# Patient Record
Sex: Male | Born: 1970 | Race: White | Hispanic: No | Marital: Married | Smoking: Never smoker
Health system: Southern US, Community
[De-identification: ages and names within clinical notes are randomized; demographics above are authoritative.]

---

## 2005-09-03 ENCOUNTER — Encounter: Admission: RE | Admit: 2005-09-03 | Discharge: 2005-09-03 | Payer: Self-pay | Admitting: Sports Medicine

## 2005-10-01 ENCOUNTER — Ambulatory Visit (HOSPITAL_COMMUNITY): Admission: RE | Admit: 2005-10-01 | Discharge: 2005-10-01 | Payer: Self-pay | Admitting: Orthopedic Surgery

## 2007-02-21 IMAGING — US US ASPIRATION
1 series · 1 of 1 positions shown · non-contrast
Comparison: none

CLINICAL DATA: Posterotibial tendinopathy.  
 ULTRASOUND GUIDED INJECTION OF THE RIGHT POSTERIOR TIBIAL TENDON SHEATH:
 The skin overlying the distal posterior tibial tendon was cleansed and draped in a sterile fashion.  Skin anesthesia was carried [DATE]% Lidocaine.  A 19 gauge needle was taken down adjacent to the tendon.  I injected 100 mg Depo-Medrol mixed with 1 cc of 1% Lidocaine, with sonographic demonstration of the liquid flowing within the tendon sheath.  The procedure was well-tolerated. The patient was discharged in good condition.

[Series 1: unknown · 0.06mm/px · 1 of 1 slices shown]
[im 1/1]
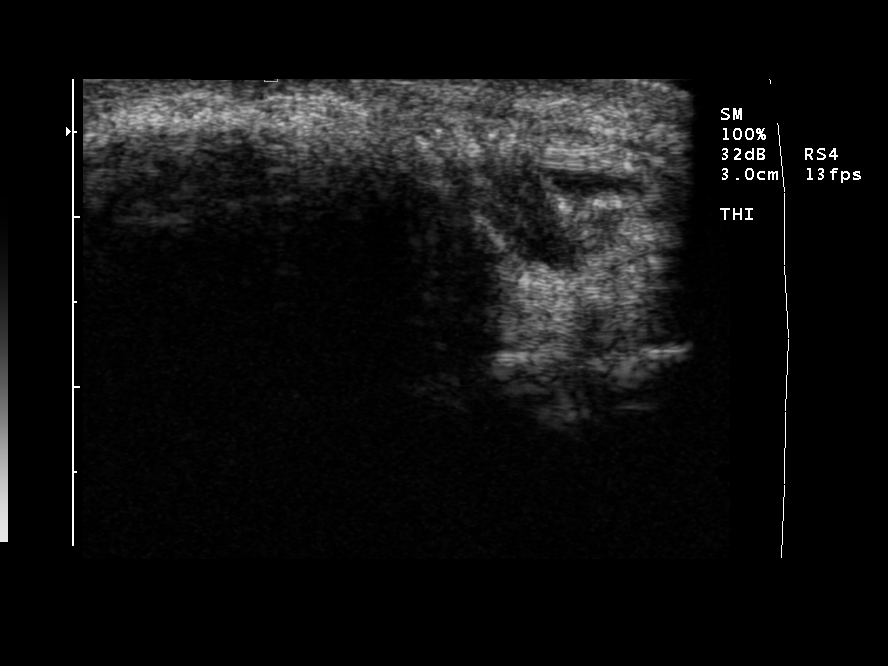

[1 of 1 positions shown; findings below may reference images not displayed]

IMPRESSION: Technically successful ultrasound guided injection of the tendon sheath of the posterior tibial tendon on the right.

## 2019-07-25 ENCOUNTER — Encounter: Payer: Self-pay | Admitting: Orthopaedic Surgery

## 2019-07-25 ENCOUNTER — Ambulatory Visit (INDEPENDENT_AMBULATORY_CARE_PROVIDER_SITE_OTHER): Payer: Managed Care, Other (non HMO) | Admitting: Orthopaedic Surgery

## 2019-07-25 ENCOUNTER — Other Ambulatory Visit: Payer: Self-pay

## 2019-07-25 DIAGNOSIS — G5601 Carpal tunnel syndrome, right upper limb: Secondary | ICD-10-CM

## 2019-07-25 NOTE — Progress Notes (Signed)
   Office Visit Note   Patient: Douglas Benson           Date of Birth: 06/04/1971           MRN: [id number] Visit Date: 07/25/2019              Requested by: No referring provider defined for this encounter. PCP: Julian Hy, MD   Assessment & Plan: Visit Diagnoses:  1. Right carpal tunnel syndrome     Plan: Impression is severe right carpal tunnel syndrome found on NCS/EMG.  Based on discussion patient has elected to proceed with surgical intervention.  Risk, benefits and poss complications reviewed.  Rehab recovery time discussed.  All questions were answered.  Follow-Up Instructions: Return for one week post-op.   Orders:  No orders of the defined types were placed in this encounter.  No orders of the defined types were placed in this encounter.     Procedures: No procedures performed   Clinical Data: No additional findings.   Subjective: Chief Complaint  Patient presents with  . 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  He has been dealing with this for about a year.  He was seen by his primary care provider in December 2020 where nerve conduction study was ordered.  This showed severe median nerve compression on the right.  He was sent to [place] for further evaluation treatment recommendation.  He does note pain to the wrist and arm as well as some numbness and tingling to the median nerve distribution.  He has trouble making a fist due to this.  He does note his symptoms have improved over the past 2 months since starting gabapentin.  Review of Systems as detailed in HPI.  All others reviewed and are negative.   Objective: Vital Signs: There were no vitals taken for this visit.  Physical Exam well-developed well-nourished gentleman in no acute distress.  Alert and oriented x3.  Ortho Exam  Specialty Comments:  No specialty comments available.  Imaging: No  new imaging   PMFS History: There are no problems to display for this patient.  History reviewed. No pertinent past medical history.  History reviewed. No pertinent family history.  History reviewed. No pertinent surgical history. Social History   Occupational History  . Not on file  Tobacco Use  . Smoking status: Not on file  Substance and Sexual Activity  . Alcohol use: Not on file  . Drug use: Not on file  . Sexual activity: Not on file

## 2019-08-03 ENCOUNTER — Other Ambulatory Visit: Payer: Self-pay | Admitting: Orthopaedic Surgery

## 2019-08-03 DIAGNOSIS — G5601 Carpal tunnel syndrome, right upper limb: Secondary | ICD-10-CM | POA: Diagnosis not present

## 2019-08-10 ENCOUNTER — Ambulatory Visit (INDEPENDENT_AMBULATORY_CARE_PROVIDER_SITE_OTHER): Payer: Managed Care, Other (non HMO) | Admitting: Physician Assistant

## 2019-08-10 ENCOUNTER — Other Ambulatory Visit: Payer: Self-pay

## 2019-08-10 DIAGNOSIS — Z9889 Other specified postprocedural states: Secondary | ICD-10-CM

## 2019-08-10 NOTE — Progress Notes (Signed)
   Post-Op Visit Note   Patient: Douglas Benson           Date of Birth: [birth year]           MRN: [id number] Visit Date: 08/10/2019 PCP: Julian Hy, MD   Assessment & Plan:  Chief Complaint:  Chief Complaint  Patient presents with  . Right Hand - Routine Post Op   Visit Diagnoses:  1. S/P carpal tunnel release     Plan: Patient is a pleasant 49 year old gentleman who comes in today 1 week out right carpal tunnel release, date of surgery 08/03/2019.  He has been doing well.  He did remove the splint 3 days postop.  No fevers or chills.  No pain.  Examination of the right hand reveals a well-healing surgical incision without complication.  Nylon sutures in place.  Today, we will reapply Band-Aid and removable splint.  There will be no submerging his hand in water or heavy lifting for another 3 weeks.  He will follow-up with [place] in 1 week's time for repeat evaluation and suture removal.  Call with concerns or questions anytime.  Follow-Up Instructions: Return in about 1 week (around 08/17/2019).   Orders:  No orders of the defined types were placed in this encounter.  No orders of the defined types were placed in this encounter.   Imaging: No new imaging  PMFS History: There are no problems to display for this patient.  No past medical history on file.  No family history on file.  No past surgical history on file. Social History   Occupational History  . Not on file  Tobacco Use  . Smoking status: Not on file  Substance and Sexual Activity  . Alcohol use: Not on file  . Drug use: Not on file  . Sexual activity: Not on file

## 2019-08-17 ENCOUNTER — Other Ambulatory Visit: Payer: Self-pay

## 2019-08-17 ENCOUNTER — Encounter: Payer: Self-pay | Admitting: Physician Assistant

## 2019-08-17 ENCOUNTER — Ambulatory Visit (INDEPENDENT_AMBULATORY_CARE_PROVIDER_SITE_OTHER): Payer: Managed Care, Other (non HMO) | Admitting: Physician Assistant

## 2019-08-17 DIAGNOSIS — Z9889 Other specified postprocedural states: Secondary | ICD-10-CM

## 2019-08-17 MED ORDER — MUPIROCIN 2 % EX OINT: TOPICAL_OINTMENT | CUTANEOUS | 0 refills | Status: AC

## 2019-08-17 NOTE — Progress Notes (Signed)
   Post-Op Visit Note   Patient: Douglas Benson           Date of Birth: 1971/01/24           MRN: [id number] Visit Date: 08/17/2019 PCP: Julian Hy, MD   Assessment & Plan:  Chief Complaint:  Chief Complaint  Patient presents with  . Post-op Follow-up   Visit Diagnoses:  1. S/P carpal tunnel release     Plan: Patient is a pleasant 49 year old gentleman who comes in today 2 weeks out right carpal tunnel release.  He has been doing well.  No fevers or chills.  No pain.  Examination of his right wrist reveals a well-healed surgical incision with nylon sutures in place.  No complication.  Today, nylon sutures were removed.  Mupirocin and a Band-Aid were applied.  We will have him do this twice a day for the next few weeks.  No heavy lifting or submerging his hand in water for another 2 weeks.  Follow-up with [place] in 4 weeks time for recheck in anticipation of return to work full duty.  Call with concerns or questions in the meantime.  Follow-Up Instructions: Return in about 4 weeks (around 09/14/2019).   Orders:  No orders of the defined types were placed in this encounter.  Meds ordered this encounter  Medications  . mupirocin ointment (BACTROBAN) 2 %    Sig: Apply to affected area 2 times daily    Dispense:  22 g    Refill:  0    Imaging: No new imaging  PMFS History: There are no problems to display for this patient.  History reviewed. No pertinent past medical history.  History reviewed. No pertinent family history.  History reviewed. No pertinent surgical history. Social History   Occupational History  . Not on file  Tobacco Use  . Smoking status: Not on file  Substance and Sexual Activity  . Alcohol use: Not on file  . Drug use: Not on file  . Sexual activity: Not on file

## 2019-08-18 ENCOUNTER — Telehealth: Payer: Self-pay

## 2019-08-18 NOTE — Telephone Encounter (Signed)
Patient called concerning incision opening after having his stitches were removed on Thursday, 08/17/2019 for RT carpal tunnel release.  Stated that he was changing his bandage and noticed that incision was open a little.   Talked with Dwana Melena and advised her of message above.  Per Stanton Kidney, patient is to continue using Mupirocin ointment and a band aid over incision twice a day and to check incision to make sure it is not bleeding. Advised patient of Mary's message and he voiced that he understands.

## 2019-09-13 ENCOUNTER — Encounter: Payer: Self-pay | Admitting: Physician Assistant

## 2019-09-13 ENCOUNTER — Ambulatory Visit (INDEPENDENT_AMBULATORY_CARE_PROVIDER_SITE_OTHER): Payer: Managed Care, Other (non HMO) | Admitting: Physician Assistant

## 2019-09-13 ENCOUNTER — Other Ambulatory Visit: Payer: Self-pay

## 2019-09-13 DIAGNOSIS — Z9889 Other specified postprocedural states: Secondary | ICD-10-CM

## 2019-09-13 NOTE — Progress Notes (Signed)
   Post-Op Visit Note   Patient: Douglas Benson           Date of Birth: [birth year]           MRN: [id number] Visit Date: 09/13/2019 PCP: Julian Hy, MD   Assessment & Plan:  Chief Complaint:  Chief Complaint  Patient presents with  . Right Hand - Follow-up   Visit Diagnoses:  1. S/P carpal tunnel release     Plan: Patient is a pleasant 49 year old gentleman comes in today 6 weeks status post right carpal tunnel release.  He has been doing well.  No complaints of pain.  No numbness, tingling or burning.  He is ready to return to work.  Examination of his right hand reveals a fully healed surgical scar without complication. He is neurovascular intact distally.  At this point, we will write him to return to work full duty without restrictions this Friday, 09/15/2019.  He will follow-up with [place] as needed.  Follow-Up Instructions: Return if symptoms worsen or fail to improve.   Orders:  No orders of the defined types were placed in this encounter.  No orders of the defined types were placed in this encounter.   Imaging: No new imaging  PMFS History: There are no problems to display for this patient.  History reviewed. No pertinent past medical history.  History reviewed. No pertinent family history.  History reviewed. No pertinent surgical history. Social History   Occupational History  . Not on file  Tobacco Use  . Smoking status: Not on file  Substance and Sexual Activity  . Alcohol use: Not on file  . Drug use: Not on file  . Sexual activity: Not on file

## 2021-08-05 ENCOUNTER — Ambulatory Visit: Payer: Managed Care, Other (non HMO) | Admitting: Dermatology

## 2021-12-10 ENCOUNTER — Encounter: Payer: Self-pay | Admitting: Dermatology

## 2021-12-10 ENCOUNTER — Ambulatory Visit (INDEPENDENT_AMBULATORY_CARE_PROVIDER_SITE_OTHER): Payer: Managed Care, Other (non HMO) | Admitting: Dermatology

## 2021-12-10 DIAGNOSIS — L570 Actinic keratosis: Secondary | ICD-10-CM | POA: Diagnosis not present

## 2021-12-10 DIAGNOSIS — W57XXXA Bitten or stung by nonvenomous insect and other nonvenomous arthropods, initial encounter: Secondary | ICD-10-CM

## 2021-12-10 DIAGNOSIS — Z1283 Encounter for screening for malignant neoplasm of skin: Secondary | ICD-10-CM

## 2021-12-10 DIAGNOSIS — D1801 Hemangioma of skin and subcutaneous tissue: Secondary | ICD-10-CM | POA: Diagnosis not present

## 2021-12-10 DIAGNOSIS — S40261A Insect bite (nonvenomous) of right shoulder, initial encounter: Secondary | ICD-10-CM | POA: Diagnosis not present

## 2022-01-02 ENCOUNTER — Encounter: Payer: Self-pay | Admitting: Dermatology
